# Patient Record
Sex: Male | Born: 1975 | Race: Black or African American | Hispanic: No | Marital: Married | Smoking: Never smoker
Health system: Southern US, Community
[De-identification: ages and names within clinical notes are randomized; demographics above are authoritative.]

## PROBLEM LIST (undated history)

## (undated) DIAGNOSIS — Z87442 Personal history of urinary calculi: Secondary | ICD-10-CM

## (undated) HISTORY — PX: OTHER SURGICAL HISTORY: SHX169

---

## 2010-01-30 ENCOUNTER — Emergency Department (HOSPITAL_BASED_OUTPATIENT_CLINIC_OR_DEPARTMENT_OTHER)
Admission: EM | Admit: 2010-01-30 | Discharge: 2010-01-30 | Payer: Self-pay | Source: Home / Self Care | Admitting: Emergency Medicine

## 2018-03-12 ENCOUNTER — Other Ambulatory Visit: Payer: Self-pay

## 2018-03-12 ENCOUNTER — Emergency Department (HOSPITAL_BASED_OUTPATIENT_CLINIC_OR_DEPARTMENT_OTHER)
Admission: EM | Admit: 2018-03-12 | Discharge: 2018-03-12 | Disposition: A | Discharge status: Home / Self Care | Payer: BLUE CROSS/BLUE SHIELD | Attending: Emergency Medicine | Admitting: Emergency Medicine

## 2018-03-12 ENCOUNTER — Emergency Department (HOSPITAL_BASED_OUTPATIENT_CLINIC_OR_DEPARTMENT_OTHER): Payer: BLUE CROSS/BLUE SHIELD

## 2018-03-12 ENCOUNTER — Encounter (HOSPITAL_BASED_OUTPATIENT_CLINIC_OR_DEPARTMENT_OTHER): Payer: Self-pay | Admitting: Emergency Medicine

## 2018-03-12 DIAGNOSIS — N201 Calculus of ureter: Secondary | ICD-10-CM | POA: Diagnosis not present

## 2018-03-12 DIAGNOSIS — R109 Unspecified abdominal pain: Secondary | ICD-10-CM | POA: Diagnosis present

## 2018-03-12 LAB — URINALYSIS, ROUTINE W REFLEX MICROSCOPIC
Bilirubin Urine: NEGATIVE
GLUCOSE, UA: NEGATIVE mg/dL
Ketones, ur: NEGATIVE mg/dL
Leukocytes,Ua: NEGATIVE
Nitrite: NEGATIVE
Protein, ur: NEGATIVE mg/dL
Specific Gravity, Urine: 1.02 (ref 1.005–1.030)
pH: 6 (ref 5.0–8.0)

## 2018-03-12 LAB — CBC WITH DIFFERENTIAL/PLATELET
Abs Immature Granulocytes: 0.03 10*3/uL (ref 0.00–0.07)
Basophils Absolute: 0 10*3/uL (ref 0.0–0.1)
Basophils Relative: 0 %
Eosinophils Absolute: 0 10*3/uL (ref 0.0–0.5)
Eosinophils Relative: 0 %
HCT: 49 % (ref 39.0–52.0)
Hemoglobin: 15.7 g/dL (ref 13.0–17.0)
Immature Granulocytes: 0 %
Lymphocytes Relative: 13 %
Lymphs Abs: 1 10*3/uL (ref 0.7–4.0)
MCH: 27.7 pg (ref 26.0–34.0)
MCHC: 32 g/dL (ref 30.0–36.0)
MCV: 86.6 fL (ref 80.0–100.0)
Monocytes Absolute: 0.5 10*3/uL (ref 0.1–1.0)
Monocytes Relative: 7 %
Neutro Abs: 5.8 10*3/uL (ref 1.7–7.7)
Neutrophils Relative %: 80 %
Platelets: 150 10*3/uL (ref 150–400)
RBC: 5.66 MIL/uL (ref 4.22–5.81)
RDW: 12.8 % (ref 11.5–15.5)
WBC: 7.3 10*3/uL (ref 4.0–10.5)
nRBC: 0 % (ref 0.0–0.2)

## 2018-03-12 LAB — COMPREHENSIVE METABOLIC PANEL
ALT: 50 U/L — ABNORMAL HIGH (ref 0–44)
AST: 36 U/L (ref 15–41)
Albumin: 4.5 g/dL (ref 3.5–5.0)
Alkaline Phosphatase: 57 U/L (ref 38–126)
Anion gap: 7 (ref 5–15)
BUN: 12 mg/dL (ref 6–20)
CO2: 25 mmol/L (ref 22–32)
Calcium: 9.2 mg/dL (ref 8.9–10.3)
Chloride: 106 mmol/L (ref 98–111)
Creatinine, Ser: 1.19 mg/dL (ref 0.61–1.24)
GFR calc Af Amer: >60 mL/min (ref >60)
GFR calc non Af Amer: >60 mL/min (ref >60)
Glucose, Bld: 123 mg/dL — ABNORMAL HIGH (ref 70–99)
Potassium: 3.7 mmol/L (ref 3.5–5.1)
Sodium: 138 mmol/L (ref 135–145)
Total Bilirubin: 1 mg/dL (ref 0.3–1.2)
Total Protein: 7.1 g/dL (ref 6.5–8.1)

## 2018-03-12 LAB — URINALYSIS, MICROSCOPIC (REFLEX): RBC / HPF: >50 RBC/hpf (ref 0–5)

## 2018-03-12 MED ORDER — ONDANSETRON HCL 4 MG/2ML IJ SOLN
4.0000 mg | Freq: Once | INTRAMUSCULAR | Status: AC
  Administered 2018-03-12: 4 mg via INTRAVENOUS
  Filled 2018-03-12: qty 2

## 2018-03-12 MED ORDER — HYDROCODONE-ACETAMINOPHEN 5-325 MG PO TABS: 1.0000 | ORAL_TABLET | Freq: Four times a day (QID) | ORAL | 0 refills | Status: DC | PRN

## 2018-03-12 MED ORDER — MORPHINE SULFATE (PF) 4 MG/ML IV SOLN
4.0000 mg | Freq: Once | INTRAVENOUS | Status: AC
  Administered 2018-03-12: 4 mg via INTRAVENOUS
  Filled 2018-03-12: qty 1

## 2018-03-12 MED ORDER — SODIUM CHLORIDE 0.9 % IV BOLUS
1000.0000 mL | Freq: Once | INTRAVENOUS | Status: AC
  Administered 2018-03-12: 1000 mL via INTRAVENOUS

## 2018-03-12 MED ORDER — TAMSULOSIN HCL 0.4 MG PO CAPS: 0.4000 mg | ORAL_CAPSULE | Freq: Every day | ORAL | 0 refills | Status: DC

## 2018-03-12 MED ORDER — ONDANSETRON HCL 4 MG PO TABS: 4.0000 mg | ORAL_TABLET | Freq: Four times a day (QID) | ORAL | 0 refills | Status: DC

## 2018-03-12 MED ORDER — IBUPROFEN 600 MG PO TABS: 600.0000 mg | ORAL_TABLET | Freq: Four times a day (QID) | ORAL | 0 refills | Status: AC | PRN

## 2018-03-12 NOTE — ED Notes (Signed)
C/o rt flank pain onset this am  Denies ua sx

## 2018-03-12 NOTE — ED Triage Notes (Addendum)
Reports right flank pain with vomiting x 2 episodes which began this morning. Denies dysuria, diarrhea.  Alert and oriented x4, ambulatory to triage in NAD.

## 2018-03-12 NOTE — ED Provider Notes (Signed)
[facility] EMERGENCY DEPARTMENT Provider Note   CSN: [id number] Arrival date & time: 03/12/18  0910    History   Chief Complaint Chief Complaint  Patient presents with  . Flank Pain    HPI Ryan Gross is a 43 y.o. male who is previously healthy who presents with sudden onset right flank pain that began when he woke up this morning.  It radiates around to his right side of abdomen.  He denies any urinary symptoms.  He has had some nausea, but no vomiting.  He has no history of kidney stones.  He reports he does not drink a lot of sweet tea.  He denies any fevers, chest pain, shortness of breath, or diarrhea.  He reports he took Imodium prior to arrival thinking he may have been constipated.     HPI  History reviewed. No pertinent past medical history.  There are no active problems to display for this patient.        Home Medications    Prior to Admission medications   Medication Sig Start Date End Date Taking? Authorizing Provider  HYDROcodone-acetaminophen (NORCO/VICODIN) 5-325 MG tablet Take 1-2 tablets by mouth every 6 (six) hours as needed for severe pain. 03/12/18   Karen Kinnard, Waylan Boga, PA-C  ibuprofen (ADVIL,MOTRIN) 600 MG tablet Take 1 tablet (600 mg total) by mouth every 6 (six) hours as needed. 03/12/18   Treshawn Allen, Waylan Boga, PA-C  ondansetron (ZOFRAN) 4 MG tablet Take 1 tablet (4 mg total) by mouth every 6 (six) hours. 03/12/18   Lyan Moyano, Waylan Boga, PA-C  tamsulosin (FLOMAX) 0.4 MG CAPS capsule Take 1 capsule (0.4 mg total) by mouth daily after supper. 03/12/18   Emi Holes, PA-C    Family History History reviewed. No pertinent family history.  Social History Social History   Tobacco Use  . Smoking status: Not on file  Substance Use Topics  . Alcohol use: Not on file  . Drug use: Not on file     Allergies   Patient has no known allergies.   Review of Systems Review of Systems  Constitutional: Negative for chills and fever.  HENT: Negative  for facial swelling and sore throat.   Respiratory: Negative for shortness of breath.   Cardiovascular: Negative for chest pain.  Gastrointestinal: Positive for nausea. Negative for abdominal pain, diarrhea and vomiting.  Genitourinary: Positive for flank pain. Negative for discharge, dysuria, penile pain, penile swelling, scrotal swelling and testicular pain.  Musculoskeletal: Negative for back pain.  Skin: Negative for rash and wound.  Neurological: Negative for headaches.  Psychiatric/Behavioral: The patient is not nervous/anxious.      Physical Exam Updated Vital Signs BP 133/86 (BP Location: Right Arm)   Pulse 66   Temp 98.1 F (36.7 C) (Oral)   Resp 18   Ht 5\' 11"  (1.803 m)   Wt 99.8 kg   SpO2 97%   BMI 30.68 kg/m   Physical Exam Vitals signs and nursing note reviewed.  Constitutional:      General: He is not in acute distress.    Appearance: He is well-developed. He is not diaphoretic.  HENT:     Head: Normocephalic and atraumatic.     Mouth/Throat:     Pharynx: No oropharyngeal exudate.  Eyes:     General: No scleral icterus.       Right eye: No discharge.        Left eye: No discharge.     Conjunctiva/sclera: Conjunctivae normal.  Pupils: Pupils are equal, round, and reactive to light.  Neck:     Musculoskeletal: Normal range of motion and neck supple.     Thyroid: No thyromegaly.  Cardiovascular:     Rate and Rhythm: Normal rate and regular rhythm.     Heart sounds: Normal heart sounds. No murmur. No friction rub. No gallop.   Pulmonary:     Effort: Pulmonary effort is normal. No respiratory distress.     Breath sounds: Normal breath sounds. No stridor. No wheezing or rales.  Abdominal:     General: Bowel sounds are normal. There is no distension.     Palpations: Abdomen is soft.     Tenderness: There is abdominal tenderness in the right lower quadrant. There is right CVA tenderness. There is no left CVA tenderness, guarding or rebound.     Lymphadenopathy:     Cervical: No cervical adenopathy.  Skin:    General: Skin is warm and dry.     Coloration: Skin is not pale.     Findings: No rash.  Neurological:     Mental Status: He is alert.     Coordination: Coordination normal.      ED Treatments / Results  Labs (all labs ordered are listed, but only abnormal results are displayed) Labs Reviewed  URINALYSIS, ROUTINE W REFLEX MICROSCOPIC - Abnormal; Notable for the following components:      Result Value   Hgb urine dipstick LARGE (*)    All other components within normal limits  URINALYSIS, MICROSCOPIC (REFLEX) - Abnormal; Notable for the following components:   Bacteria, UA FEW (*)    All other components within normal limits  COMPREHENSIVE METABOLIC PANEL - Abnormal; Notable for the following components:   Glucose, Bld 123 (*)    ALT 50 (*)    All other components within normal limits  CBC WITH DIFFERENTIAL/PLATELET    EKG None  Radiology Ct Renal Stone Study  Result Date: 03/12/2018 CLINICAL DATA:  Right flank pain since this morning with nausea. EXAM: CT ABDOMEN AND PELVIS WITHOUT CONTRAST TECHNIQUE: Multidetector CT imaging of the abdomen and pelvis was performed following the standard protocol without IV contrast. COMPARISON:  None. FINDINGS: Lower chest: Minimal dependent bibasilar atelectasis. Hepatobiliary: Gallbladder, liver and biliary tree are normal. Pancreas: Normal. Spleen: Normal. Adrenals/Urinary Tract: Adrenal glands are normal. Kidneys normal in size. There is evidence of bilateral nephrolithiasis. 1.2 cm cyst over the upper pole left kidney. 2.8 cm cyst over the mid pole right kidney. Mild right-sided hydronephrosis with right perinephric fluid/inflammatory change. There is a 4 mm stone over the proximal right ureter just distal to the UPJ causing this low-grade obstruction. Left ureter and bladder are normal. Stomach/Bowel: Stomach and small bowel are normal. Appendix is normal. Colon is  normal. Vascular/Lymphatic: Normal. Reproductive: Normal. Other: Bilateral hydroceles. Musculoskeletal: Mild degenerate change over the lumbar spine with disc space narrowing at the L3-4 level. Mild degenerate change of the hips. IMPRESSION: Bilateral nephrolithiasis. 4 mm stone over the proximal right ureter causing low-grade obstruction. Bilateral renal cysts. Bilateral hydroceles. Electronically Signed   By: Elberta Fortis M.D.   On: 03/12/2018 11:22    Procedures Procedures (including critical care time)  Medications Ordered in ED Medications  sodium chloride 0.9 % bolus 1,000 mL ( Intravenous Stopped 03/12/18 1208)  ondansetron (ZOFRAN) injection 4 mg (4 mg Intravenous Given 03/12/18 1056)  morphine 4 MG/ML injection 4 mg (4 mg Intravenous Given 03/12/18 1056)     Initial Impression / Assessment and  Plan / ED Course  I have reviewed the triage vital signs and the nursing notes.  Pertinent labs & imaging results that were available during my care of the patient were reviewed by me and considered in my medical decision making (see chart for details).        Patient with a 4 mm stone over the proximal right ureter causing low-grade obstruction; bilateral nephrolithiasis as well as bilateral renal cysts and hydroceles.  Renal function within normal limits.  UA shows hematuria, but no signs of infection.  Patient feeling much better after morphine in the ED.  Will discharge home with ibuprofen, Norco, Zofran, Flomax.  Follow-up to urology discussed.  Patient advised to avoid sweet tea.  Patient given urine strainer.  Advised to bring stone to his urology appointment if passed by then.  Patient given return precautions.  He understands and agrees with plan.  Patient vital stable throughout ED course and discharged in satisfactory condition.  Final Clinical Impressions(s) / ED Diagnoses   Final diagnoses:  Ureterolithiasis    ED Discharge Orders         Ordered    ibuprofen (ADVIL,MOTRIN) 600  MG tablet  Every 6 hours PRN     03/12/18 1222    HYDROcodone-acetaminophen (NORCO/VICODIN) 5-325 MG tablet  Every 6 hours PRN     03/12/18 1222    ondansetron (ZOFRAN) 4 MG tablet  Every 6 hours     03/12/18 1222    tamsulosin (FLOMAX) 0.4 MG CAPS capsule  Daily after supper     03/12/18 [address] [place] 03/12/18 1933    Tegeler, Canary Brim, MD 03/13/18 [phone]

## 2018-03-12 NOTE — Discharge Instructions (Addendum)
Take ibuprofen every 6 hours as needed for pain.  For severe, breakthrough pain, you can take 1-2 Norco every 6 hours.  Do not drive or operate machinery while taking this medication.  Take Zofran every 6 hours as needed for nausea or vomiting.  Take Flomax daily before bed to help with passage of your stone.  Make sure to stay well-hydrated.  You can drink lemon water to help with the passage of your stone as well.  Please follow-up with Dr. Alvester Morin with urology for further evaluation and treatment of your kidney stone.  I recommend straining your urine and saving the stone to bring to your appointment.  Please return the emergency department if you develop any new or worsening symptoms.  Do not drink alcohol, drive, operate machinery or participate in any other potentially dangerous activities while taking opiate pain medication as it may make you sleepy. Do not take this medication with any other sedating medications, either prescription or over-the-counter. If you were prescribed Percocet or Vicodin, do not take these with acetaminophen (Tylenol) as it is already contained within these medications and overdose of Tylenol is dangerous.   This medication is an opiate (or narcotic) pain medication and can be habit forming.  Use it as little as possible to achieve adequate pain control.  Do not use or use it with extreme caution if you have a history of opiate abuse or dependence. This medication is intended for your use only - do not give any to anyone else and keep it in a secure place where nobody else, especially children, have access to it. It will also cause or worsen constipation, so you may want to consider taking an over-the-counter stool softener while you are taking this medication.

## 2018-03-15 ENCOUNTER — Other Ambulatory Visit: Payer: Self-pay | Admitting: Urology

## 2018-03-16 ENCOUNTER — Encounter (HOSPITAL_COMMUNITY): Payer: Self-pay | Admitting: *Deleted

## 2018-03-19 ENCOUNTER — Other Ambulatory Visit: Payer: Self-pay

## 2018-03-19 ENCOUNTER — Ambulatory Visit (HOSPITAL_COMMUNITY): Payer: BLUE CROSS/BLUE SHIELD

## 2018-03-19 ENCOUNTER — Encounter (HOSPITAL_COMMUNITY): Payer: Self-pay | Admitting: *Deleted

## 2018-03-19 ENCOUNTER — Ambulatory Visit (HOSPITAL_COMMUNITY)
Admission: RE | Admit: 2018-03-19 | Discharge: 2018-03-19 | Disposition: A | Discharge status: Home / Self Care | Payer: BLUE CROSS/BLUE SHIELD | Attending: Urology | Admitting: Urology

## 2018-03-19 ENCOUNTER — Encounter (HOSPITAL_COMMUNITY)
Admission: RE | Disposition: A | Discharge status: Home / Self Care | Payer: Self-pay | Source: Home / Self Care | Attending: Urology

## 2018-03-19 DIAGNOSIS — N200 Calculus of kidney: Secondary | ICD-10-CM | POA: Insufficient documentation

## 2018-03-19 DIAGNOSIS — Z79899 Other long term (current) drug therapy: Secondary | ICD-10-CM | POA: Insufficient documentation

## 2018-03-19 DIAGNOSIS — N201 Calculus of ureter: Secondary | ICD-10-CM

## 2018-03-19 HISTORY — PX: EXTRACORPOREAL SHOCK WAVE LITHOTRIPSY: SHX1557

## 2018-03-19 HISTORY — DX: Personal history of urinary calculi: Z87.442

## 2018-03-19 SURGERY — LITHOTRIPSY, ESWL
Anesthesia: LOCAL | Laterality: Right

## 2018-03-19 MED ORDER — DIPHENHYDRAMINE HCL 25 MG PO CAPS
25.0000 mg | ORAL_CAPSULE | ORAL | Status: AC
  Administered 2018-03-19: 25 mg via ORAL
  Filled 2018-03-19: qty 1

## 2018-03-19 MED ORDER — HYDROCODONE-ACETAMINOPHEN 5-325 MG PO TABS: 1.0000 | ORAL_TABLET | Freq: Four times a day (QID) | ORAL | 0 refills | Status: AC | PRN

## 2018-03-19 MED ORDER — DIAZEPAM 5 MG PO TABS
10.0000 mg | ORAL_TABLET | ORAL | Status: AC
  Administered 2018-03-19: 10 mg via ORAL
  Filled 2018-03-19: qty 2

## 2018-03-19 MED ORDER — CIPROFLOXACIN HCL 500 MG PO TABS
500.0000 mg | ORAL_TABLET | ORAL | Status: AC
  Administered 2018-03-19: 500 mg via ORAL
  Filled 2018-03-19: qty 1

## 2018-03-19 MED ORDER — OXYCODONE HCL 5 MG PO TABS
10.0000 mg | ORAL_TABLET | Freq: Once | ORAL | Status: AC | PRN
  Administered 2018-03-19: 10 mg via ORAL

## 2018-03-19 MED ORDER — ONDANSETRON HCL 4 MG PO TABS: 4.0000 mg | ORAL_TABLET | Freq: Four times a day (QID) | ORAL | 0 refills | Status: AC

## 2018-03-19 MED ORDER — SODIUM CHLORIDE 0.9 % IV SOLN
INTRAVENOUS | Status: DC
  Administered 2018-03-19: 08:00:00 via INTRAVENOUS

## 2018-03-19 MED ORDER — TAMSULOSIN HCL 0.4 MG PO CAPS: 0.4000 mg | ORAL_CAPSULE | Freq: Every day | ORAL | 0 refills | Status: AC

## 2018-03-19 MED ORDER — OXYCODONE HCL 5 MG PO TABS
ORAL_TABLET | ORAL | Status: AC
  Filled 2018-03-19: qty 2

## 2018-03-19 MED ORDER — OXYCODONE HCL 5 MG/5ML PO SOLN: 5.0000 mg | Freq: Once | ORAL | Status: AC | PRN

## 2018-03-20 ENCOUNTER — Encounter (HOSPITAL_COMMUNITY): Payer: Self-pay | Admitting: Urology

## 2020-06-07 IMAGING — DX ABDOMEN - 1 VIEW
2 series · 2 of 2 positions shown · non-contrast
Comparison: CT 03/12/2018

CLINICAL DATA: Preop for lithotripsy of right kidney stone.

EXAM:
ABDOMEN - 1 VIEW

[abdomen kub (1 of 2)]
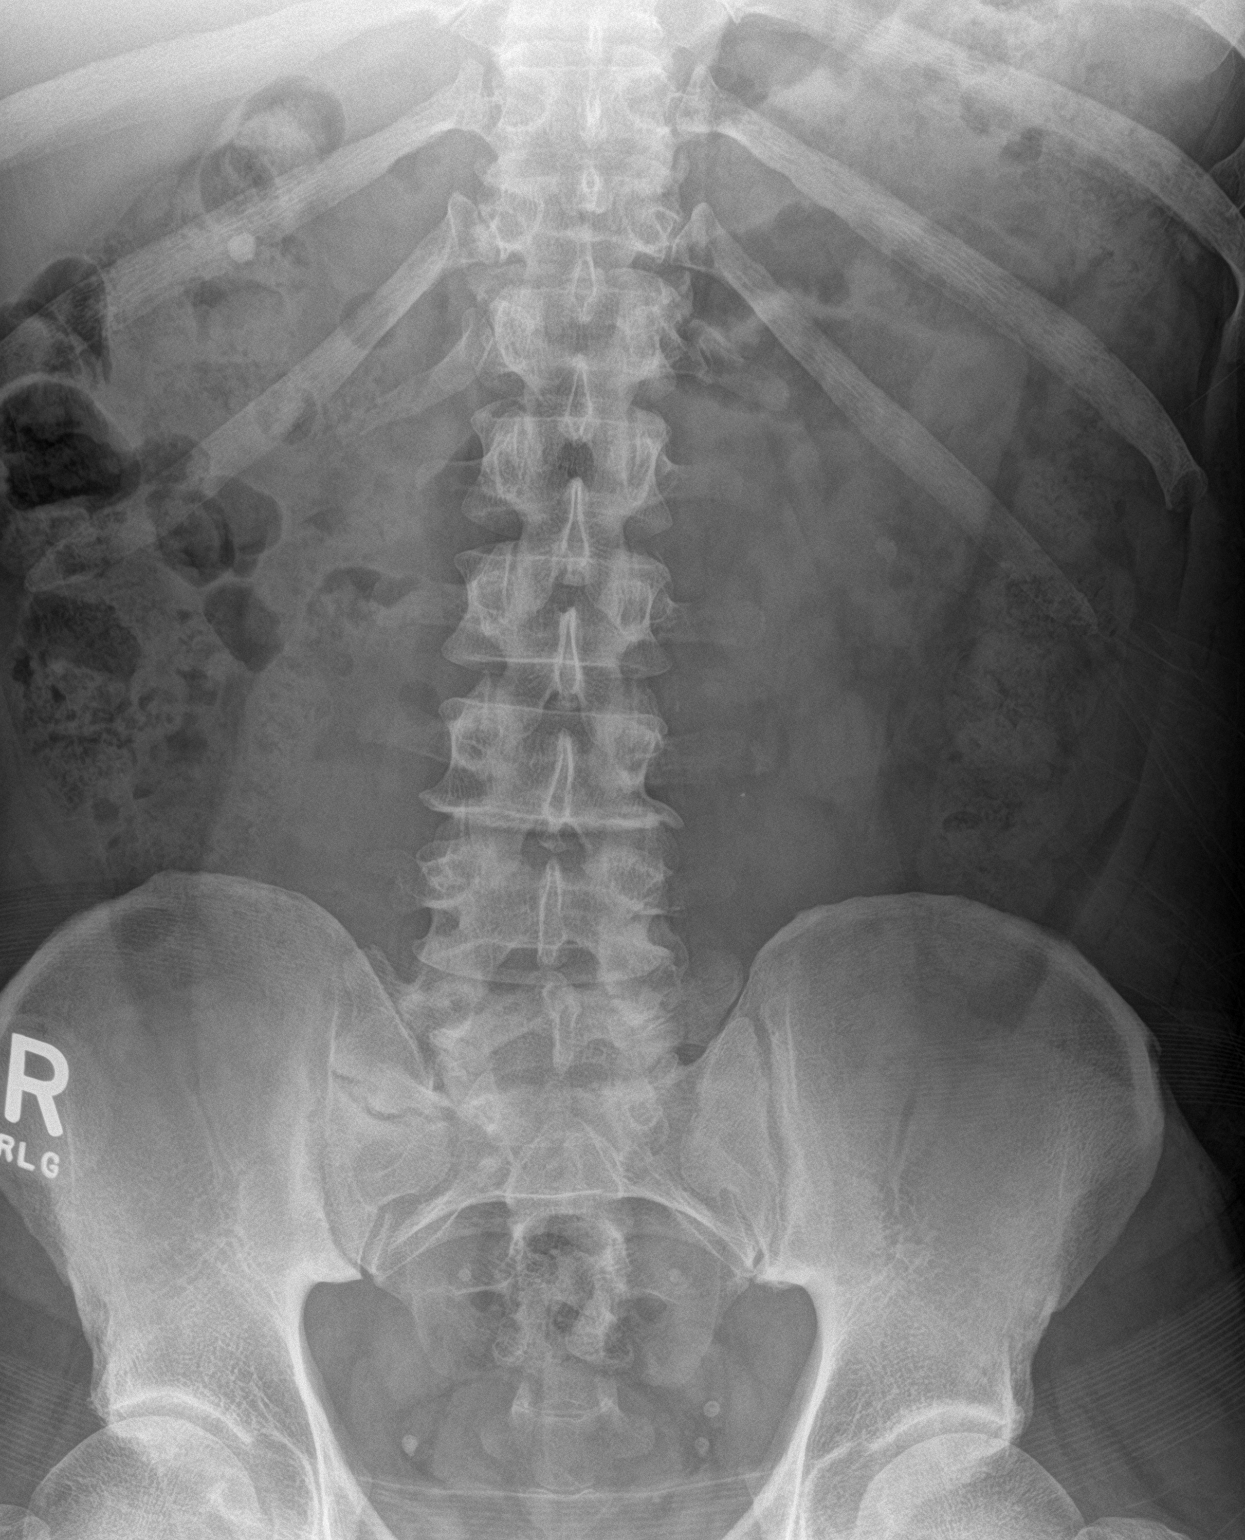

[abdomen kub (2 of 2)]
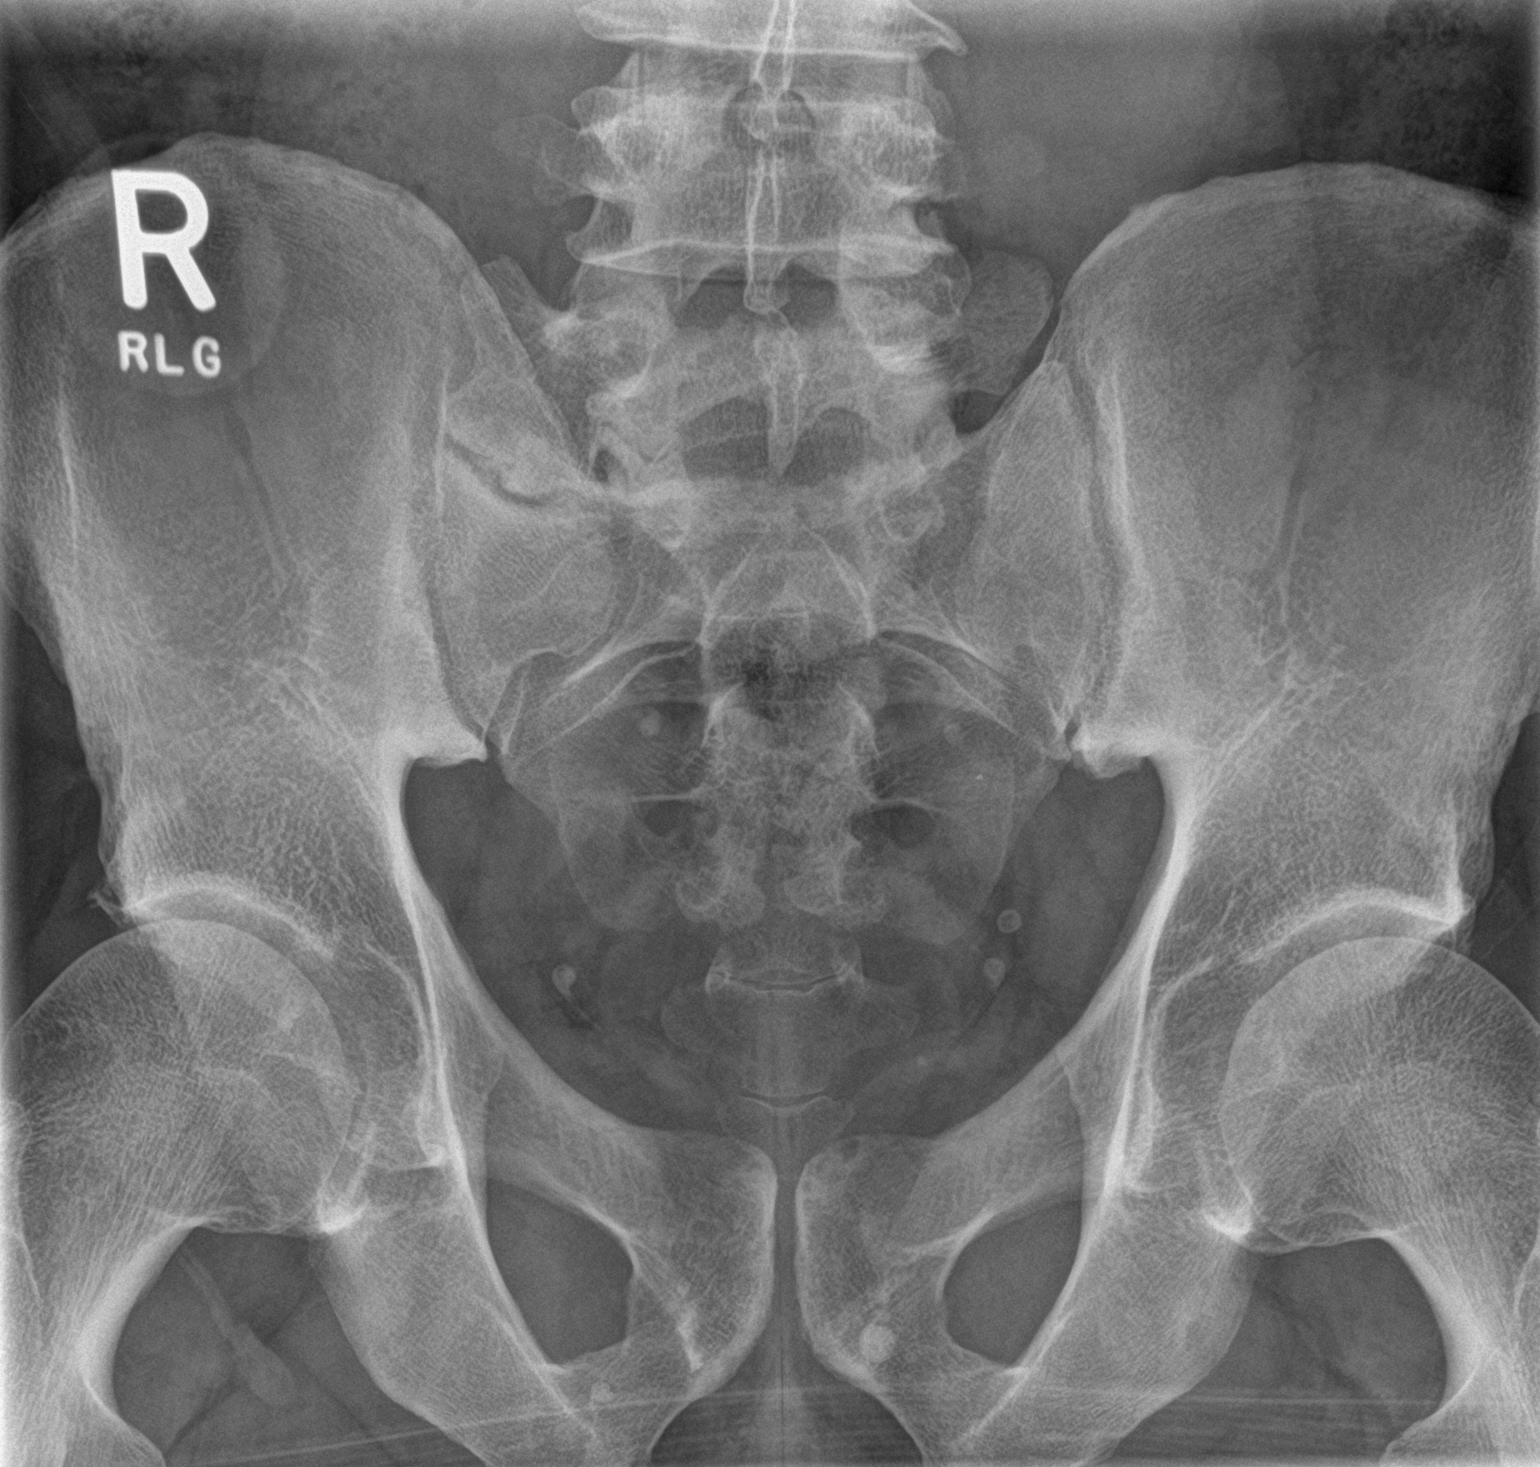

[2 of 2 positions shown; findings below may reference images not displayed]

FINDINGS: Again noted is a 8 mm stone in the right kidney upper pole region.
Previously noted stone in the proximal right ureter may be in the
distal right ureter region within the pelvis. There is a
calcification in right upper pelvic region that was not clearly
present on the previous CT. Stable phleboliths in the pelvis. Again
noted is a 6 mm stone in left kidney lower pole. Nonobstructive
bowel gas pattern. Moderate amount of stool in the abdomen. Bone
structures are unremarkable.
IMPRESSION: 1. Right ureter stone may have migrated into the distal right ureter
region. There appears to be new 4 mm calcification in the right
hemipelvis that could correspond with the previously identified
ureter stone.
2. Bilateral renal calculi.
